# Patient Record
Sex: Male | Born: 2000 | Hispanic: No | Marital: Single | Smoking: Never smoker
Health system: Southern US, Community
[De-identification: ages and names within clinical notes are randomized; demographics above are authoritative.]

## PROBLEM LIST (undated history)

## (undated) DIAGNOSIS — R51 Headache: Secondary | ICD-10-CM

## (undated) DIAGNOSIS — R519 Headache, unspecified: Secondary | ICD-10-CM

## (undated) DIAGNOSIS — H9313 Tinnitus, bilateral: Secondary | ICD-10-CM

## (undated) HISTORY — DX: Headache: R51

## (undated) HISTORY — DX: Headache, unspecified: R51.9

## (undated) HISTORY — DX: Tinnitus, bilateral: H93.13

---
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
## 2019-03-16 IMAGING — MR MR HEAD W/O CM
9 of 12 series · 32 of 48 positions shown · non-contrast
Comparison: None.

CLINICAL DATA: Occipital headaches. Bilateral tinnitus. Progressive
symptoms.

EXAM:
MRI HEAD WITHOUT CONTRAST
MRA HEAD WITHOUT CONTRAST
TECHNIQUE: Multiplanar, multiecho pulse sequences of the brain and surrounding
structures were obtained without intravenous contrast. Angiographic
images of the head were obtained using MRA technique without
contrast.

[Series 3: (id) mt fs · axial · 1.0mm · 0.43mm/px · z∈[-35,+19]mm · 5 of 202 slices shown]
[im 1/202]
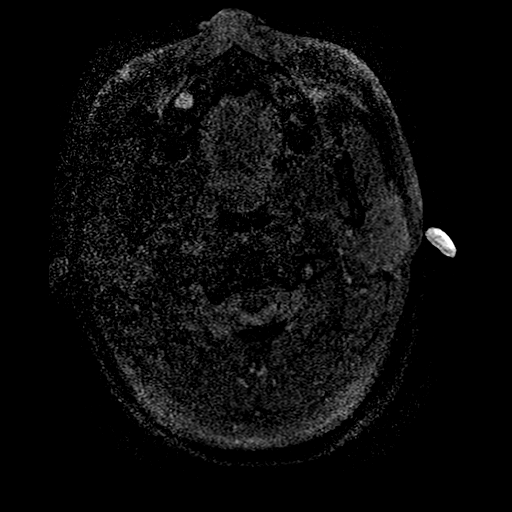
[im 31/202]
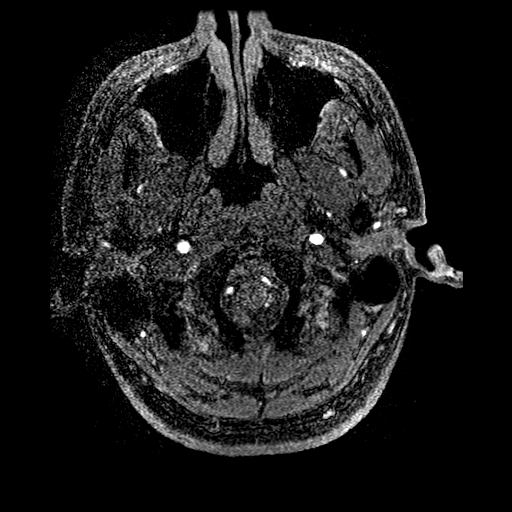
[im 62/202]
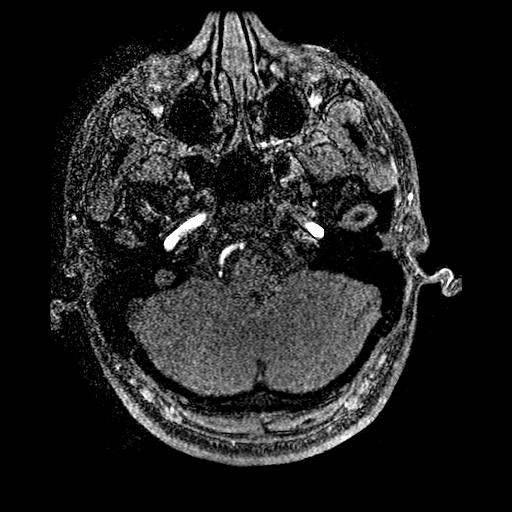
[im 93/202]
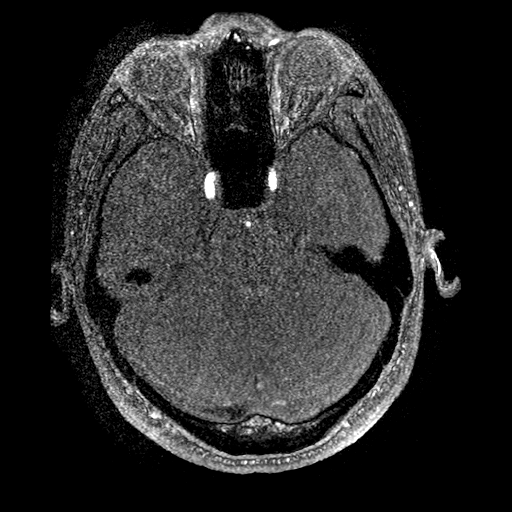
[im 109/202]
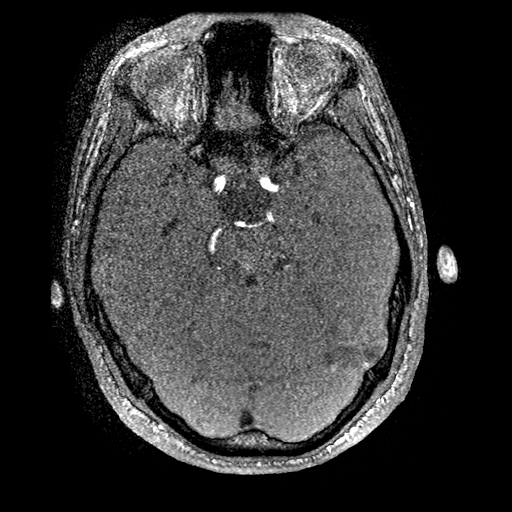

[Series 4: DWI · axial · 3.0mm · 1.09mm/px · z∈[-42,+105]mm · 7 of 100 slices shown (1 of 4)]
[im 1/100]
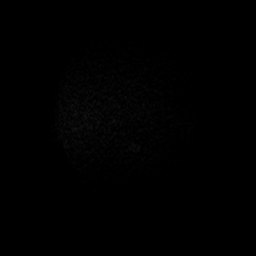
[im 17/100]
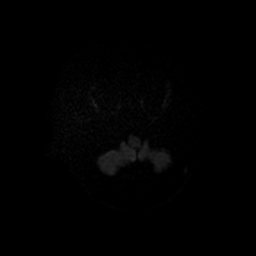
[im 34/100]
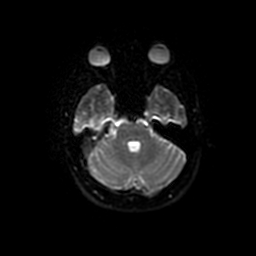
[im 50/100]
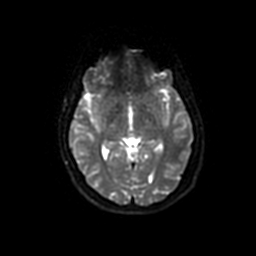
[im 67/100]
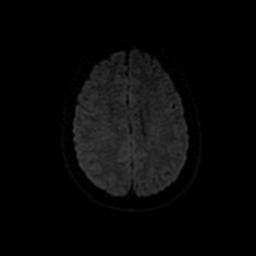
[im 83/100]
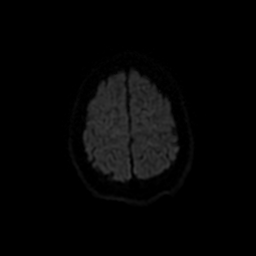
[im 100/100]
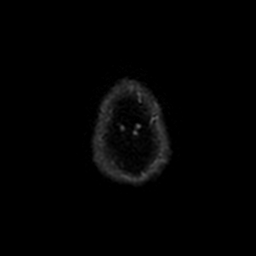

[Series 5: DWI · coronal · 5.0mm · 1.09mm/px · 5 of 74 slices shown (2 of 4)]
[im 1/74]
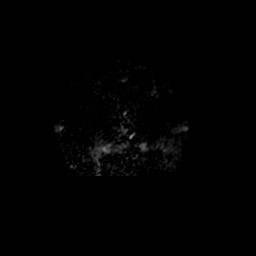
[im 19/74]
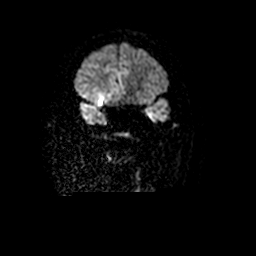
[im 37/74]
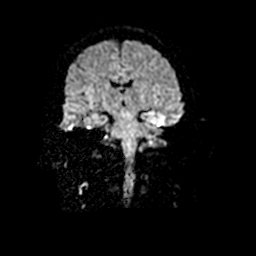
[im 55/74]
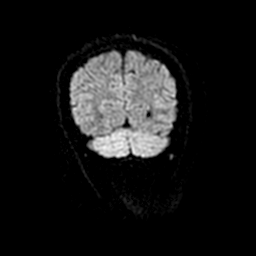
[im 74/74]
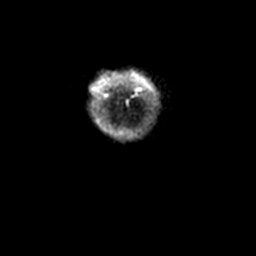

[Series 7: T2 · axial · 5.0mm · 0.43mm/px · z∈[-42,+96]mm · 2 of 24 slices shown (1 of 2)]
[im 1/24]
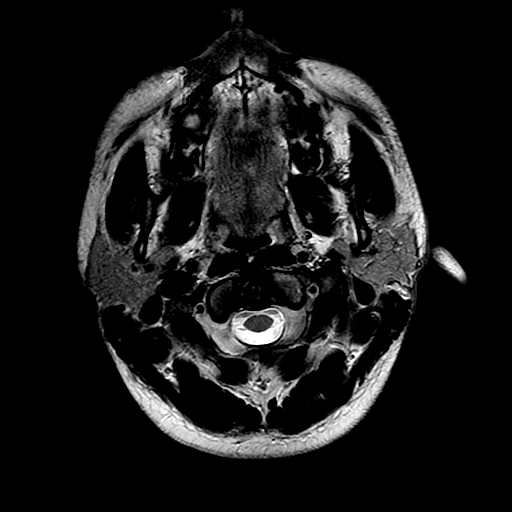
[im 24/24]
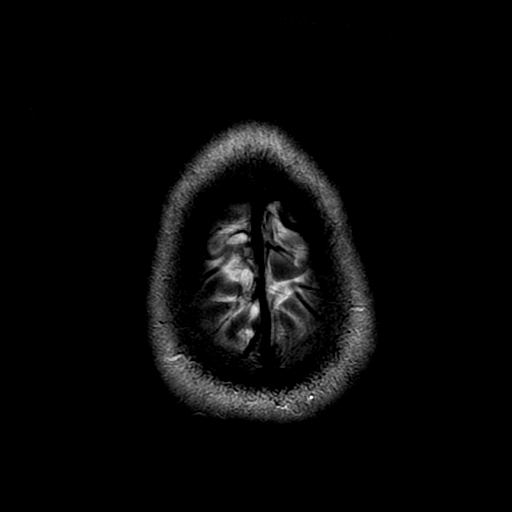

[Series 8: T1 · sagittal · 5.0mm · 0.47mm/px · 2 of 21 slices shown]
[im 1/21]
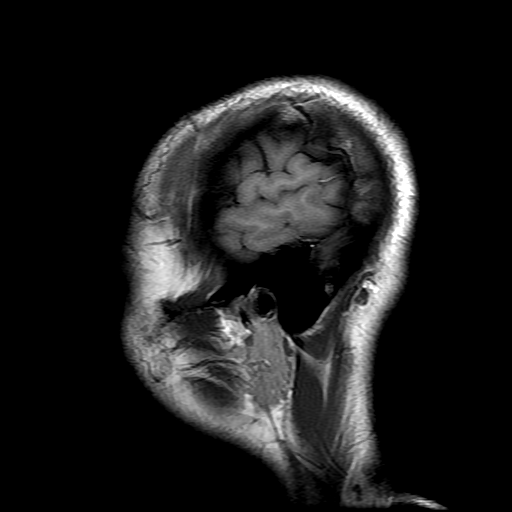
[im 21/21]
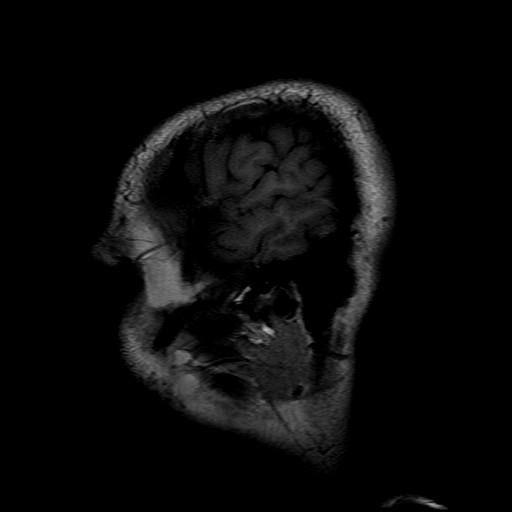

[Series 9: FLAIR · axial · 5.0mm · 0.43mm/px · z∈[-42,+96]mm · 2 of 24 slices shown]
[im 1/24]
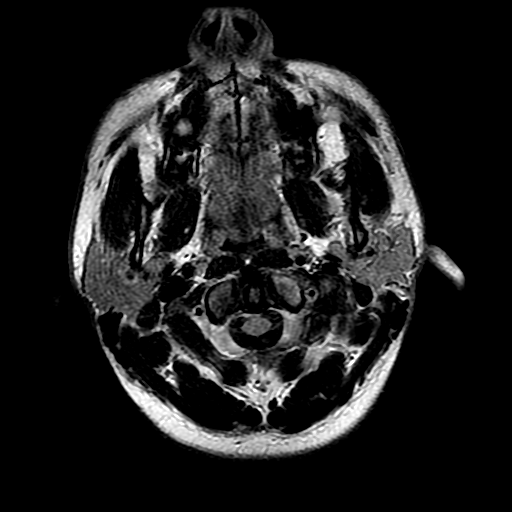
[im 24/24]
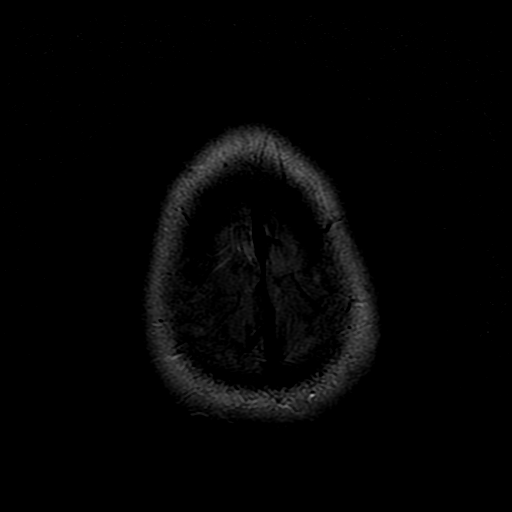

[Series 12: T2 · coronal · 5.0mm · 0.45mm/px · 2 of 27 slices shown (2 of 2)]
[im 1/27]
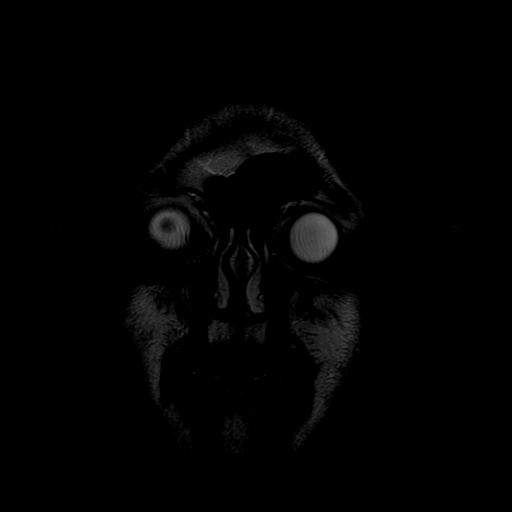
[im 27/27]
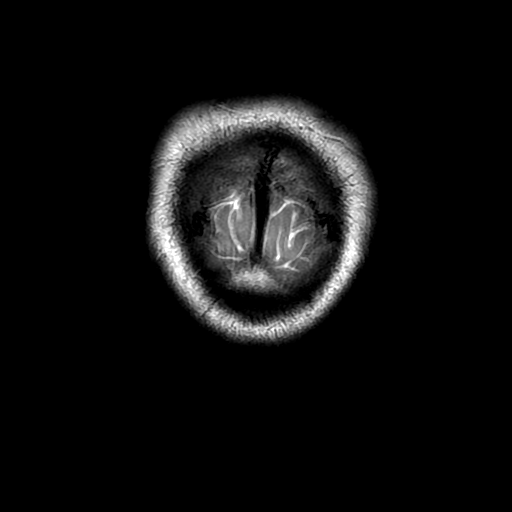

[Series 400: DWI · axial · 3.0mm · 1.09mm/px · z∈[-42,+105]mm · 4 of 50 slices shown (3 of 4)]
[im 1/50]
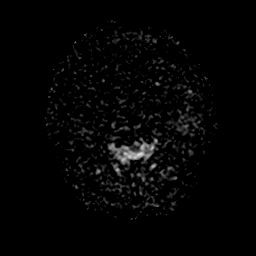
[im 17/50]
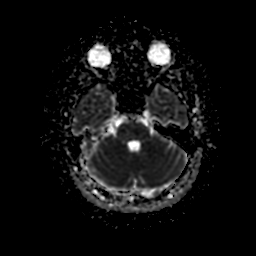
[im 33/50]
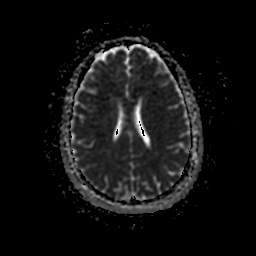
[im 50/50]
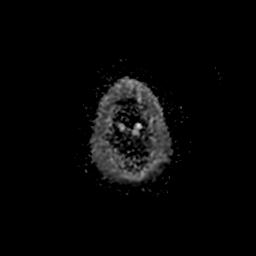

[Series 500: DWI · coronal · 5.0mm · 1.09mm/px · 3 of 37 slices shown (4 of 4)]
[im 1/37]
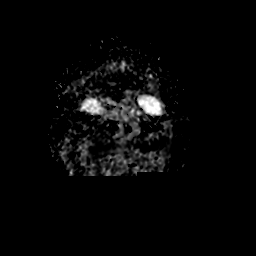
[im 19/37]
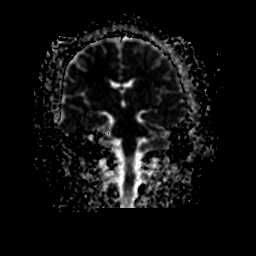
[im 37/37]
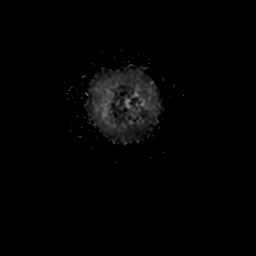

[32 of 48 positions shown; findings below may reference images not displayed]

FINDINGS: MRI HEAD FINDINGS

Brain: No acute infarct, hemorrhage, or mass lesion is present. The
ventricles are of normal size. No significant extraaxial fluid
collection is present. No significant white matter disease is
present. The internal auditory canals are normal bilaterally. The
brainstem and cerebellum are normal.

Vascular: Low is present in the major intracranial arteries.

Skull and upper cervical spine: The skullbase is within normal
limits. The craniocervical junction is normal. Midline sagittal
structures are unremarkable.

Sinuses/Orbits: The paranasal sinuses and mastoid air cells are
clear. The globes and orbits are within normal limits.

MRA HEAD FINDINGS

Internal carotid artery is are within normal limits bilaterally. The
A1 and M1 segments are normal. Anterior communicating artery is
patent. The right A1 segment is dominant. MCA bifurcations are
intact. ACA and MCA branch vessels are within normal limits.

The right vertebral artery is the dominant vessel. PICA origins are
visualized and normal bilaterally. The basilar artery is normal. The
right posterior cerebral artery originates from basilar tip. The
left posterior cerebral artery is of fetal type. A small left P1
segment is present. PCA branch vessels are normal bilaterally.
IMPRESSION: 1. Normal MRI appearance of the brain.
2. Normal variant MRA circle of Willis. No significant proximal
stenosis, aneurysm, or branch vessel occlusion is present.
